# Patient Record
Sex: Male | Born: 1980 | Race: Black or African American | Hispanic: No | Marital: Single | Smoking: Never smoker
Health system: Southern US, Community
[De-identification: ages and names within clinical notes are randomized; demographics above are authoritative.]

---

## 2009-12-23 ENCOUNTER — Emergency Department: Payer: Self-pay | Admitting: Emergency Medicine

## 2018-12-03 ENCOUNTER — Emergency Department: Payer: Self-pay

## 2018-12-03 ENCOUNTER — Emergency Department
Admission: EM | Admit: 2018-12-03 | Discharge: 2018-12-03 | Disposition: A | Discharge status: Home / Self Care | Payer: Self-pay | Attending: Emergency Medicine | Admitting: Emergency Medicine

## 2018-12-03 ENCOUNTER — Other Ambulatory Visit: Payer: Self-pay

## 2018-12-03 ENCOUNTER — Encounter: Payer: Self-pay | Admitting: Emergency Medicine

## 2018-12-03 DIAGNOSIS — K8020 Calculus of gallbladder without cholecystitis without obstruction: Secondary | ICD-10-CM | POA: Insufficient documentation

## 2018-12-03 DIAGNOSIS — R1013 Epigastric pain: Secondary | ICD-10-CM | POA: Insufficient documentation

## 2018-12-03 LAB — COMPREHENSIVE METABOLIC PANEL
ALT: 17 U/L (ref 0–44)
AST: 21 U/L (ref 15–41)
Albumin: 4.3 g/dL (ref 3.5–5.0)
Alkaline Phosphatase: 74 U/L (ref 38–126)
Anion gap: 9 (ref 5–15)
BUN: 21 mg/dL — ABNORMAL HIGH (ref 6–20)
CO2: 23 mmol/L (ref 22–32)
Calcium: 9.1 mg/dL (ref 8.9–10.3)
Chloride: 105 mmol/L (ref 98–111)
Creatinine, Ser: 0.99 mg/dL (ref 0.61–1.24)
GFR calc Af Amer: >60 mL/min (ref >60)
GFR calc non Af Amer: >60 mL/min (ref >60)
Glucose, Bld: 147 mg/dL — ABNORMAL HIGH (ref 70–99)
Potassium: 3.5 mmol/L (ref 3.5–5.1)
Sodium: 137 mmol/L (ref 135–145)
Total Bilirubin: 0.5 mg/dL (ref 0.3–1.2)
Total Protein: 7.8 g/dL (ref 6.5–8.1)

## 2018-12-03 LAB — CBC
HCT: 40.1 % (ref 39.0–52.0)
Hemoglobin: 13.3 g/dL (ref 13.0–17.0)
MCH: 27.1 pg (ref 26.0–34.0)
MCHC: 33.2 g/dL (ref 30.0–36.0)
MCV: 81.8 fL (ref 80.0–100.0)
Platelets: 251 10*3/uL (ref 150–400)
RBC: 4.9 MIL/uL (ref 4.22–5.81)
RDW: 13.2 % (ref 11.5–15.5)
WBC: 7.5 10*3/uL (ref 4.0–10.5)
nRBC: 0 % (ref 0.0–0.2)

## 2018-12-03 LAB — LIPASE, BLOOD: Lipase: 27 U/L (ref 11–51)

## 2018-12-03 MED ORDER — ONDANSETRON 4 MG PO TBDP: 4.0000 mg | ORAL_TABLET | Freq: Three times a day (TID) | ORAL | 0 refills | Status: DC | PRN

## 2018-12-03 MED ORDER — HYDROCODONE-ACETAMINOPHEN 5-325 MG PO TABS: 1.0000 | ORAL_TABLET | Freq: Four times a day (QID) | ORAL | 0 refills | Status: DC | PRN

## 2018-12-03 MED ORDER — OXYCODONE-ACETAMINOPHEN 5-325 MG PO TABS
1.0000 | ORAL_TABLET | Freq: Once | ORAL | Status: AC
  Administered 2018-12-03: 1 via ORAL
  Filled 2018-12-03: qty 1

## 2018-12-03 MED ORDER — OMEPRAZOLE 20 MG PO CPDR: 20.0000 mg | DELAYED_RELEASE_CAPSULE | Freq: Two times a day (BID) | ORAL | 0 refills | Status: DC

## 2018-12-03 MED ORDER — SODIUM CHLORIDE 0.9 % IV BOLUS
1000.0000 mL | Freq: Once | INTRAVENOUS | Status: AC
  Administered 2018-12-03: 09:00:00 1000 mL via INTRAVENOUS

## 2018-12-03 MED ORDER — IOHEXOL 300 MG/ML  SOLN
100.0000 mL | Freq: Once | INTRAMUSCULAR | Status: AC | PRN
  Administered 2018-12-03: 100 mL via INTRAVENOUS

## 2018-12-03 MED ORDER — ONDANSETRON HCL 4 MG/2ML IJ SOLN
4.0000 mg | Freq: Once | INTRAMUSCULAR | Status: AC
  Administered 2018-12-03: 4 mg via INTRAVENOUS
  Filled 2018-12-03: qty 2

## 2018-12-03 MED ORDER — HYDROMORPHONE HCL 1 MG/ML IJ SOLN
1.0000 mg | Freq: Once | INTRAMUSCULAR | Status: AC
  Administered 2018-12-03: 1 mg via INTRAVENOUS
  Filled 2018-12-03: qty 1

## 2018-12-03 NOTE — ED Provider Notes (Signed)
[facility] Emergency Department Provider Note [facility] ____________________________________________   First MD Initiated Contact with Patient 12/03/18 0809     (approximate)  I have reviewed the triage vital signs and the nursing notes.   HISTORY  Chief Complaint Abdominal Pain    HPI Matthew Church is a 38 y.o. male  Here with abdominal pain, nausea, vomiting.  Patient states that his symptoms started late last night, around 10:11 PM.  He states that he was in his usual state of health until try to go to bed.  He then developed gradual onset of progressively worsening aching, throbbing, epigastric pain with associated nausea.  He has had significant, severe, primarily epigastric pain since then.  The pain does not radiate to his back or sides.  He has some associated right upper quadrant tenderness with it.  Denies any history of similar symptoms in the past.  Denies any alleviating factors.  Of note, his pain began after eating pizza and chicken wings.  No known history of gallstones.  No fevers.  No sick contacts.        History reviewed. No pertinent past medical history.  There are no active problems to display for this patient.   History reviewed. No pertinent surgical history.  Prior to Admission medications   Medication Sig Start Date End Date Taking? Authorizing Provider  HYDROcodone-acetaminophen (NORCO/VICODIN) 5-325 MG tablet Take 1-2 tablets by mouth every 6 (six) hours as needed for moderate pain or severe pain. 12/03/18 12/03/19  Shaune PollackIsaacs, Sakoya Win, MD  omeprazole (PRILOSEC) 20 MG capsule Take 1 capsule (20 mg total) by mouth 2 (two) times daily before a meal for 10 days. 12/03/18 12/13/18  Shaune PollackIsaacs, Jaquell Seddon, MD  ondansetron (ZOFRAN ODT) 4 MG disintegrating tablet Take 1 tablet (4 mg total) by mouth every 8 (eight) hours as needed for nausea or vomiting. 12/03/18   Shaune PollackIsaacs, Quenna Doepke, MD    Allergies Patient has no allergy information on record.  No family  history on file.  Social History Social History   Tobacco Use  . Smoking status: Not on file  Substance Use Topics  . Alcohol use: Not on file  . Drug use: Not on file    Review of Systems  Review of Systems  Constitutional: Positive for fatigue. Negative for chills and fever.  HENT: Negative for sore throat.   Respiratory: Negative for shortness of breath.   Cardiovascular: Negative for chest pain.  Gastrointestinal: Positive for abdominal pain and nausea.  Genitourinary: Negative for flank pain.  Musculoskeletal: Negative for neck pain.  Skin: Negative for rash and wound.  Allergic/Immunologic: Negative for immunocompromised state.  Neurological: Negative for weakness and numbness.  Hematological: Does not bruise/bleed easily.  All other systems reviewed and are negative.    ____________________________________________  PHYSICAL EXAM:      VITAL SIGNS: ED Triage Vitals [12/03/18 0816]  Enc Vitals Group     BP 121/62     Pulse Rate 78     Resp 18     Temp 98.2 F (36.8 C)     Temp Source Oral     SpO2 100 %     Weight 270 lb (122.5 kg)     Height 5\' 11"  (1.803 m)     Head Circumference      Peak Flow      Pain Score 10     Pain Loc      Pain Edu?      Excl. in GC?  Physical Exam Vitals signs and nursing note reviewed.  Constitutional:      General: He is not in acute distress.    Appearance: He is well-developed.     Comments: Appears uncomfortable  HENT:     Head: Normocephalic and atraumatic.  Eyes:     Conjunctiva/sclera: Conjunctivae normal.  Neck:     Musculoskeletal: Neck supple.  Cardiovascular:     Rate and Rhythm: Normal rate and regular rhythm.     Heart sounds: Normal heart sounds. No murmur. No friction rub.  Pulmonary:     Effort: Pulmonary effort is normal. No respiratory distress.     Breath sounds: Normal breath sounds. No wheezing or rales.  Abdominal:     General: There is no distension.     Palpations: Abdomen is soft.      Tenderness: There is abdominal tenderness in the right upper quadrant and epigastric area. There is no right CVA tenderness, left CVA tenderness, guarding or rebound.  Skin:    General: Skin is warm.     Capillary Refill: Capillary refill takes less than 2 seconds.  Neurological:     Mental Status: He is alert and oriented to person, place, and time.     Motor: No abnormal muscle tone.       ____________________________________________   LABS (all labs ordered are listed, but only abnormal results are displayed)  Labs Reviewed  COMPREHENSIVE METABOLIC PANEL - Abnormal; Notable for the following components:      Result Value   Glucose, Bld 147 (*)    BUN 21 (*)    All other components within normal limits  LIPASE, BLOOD  CBC  URINALYSIS, COMPLETE (UACMP) WITH MICROSCOPIC    ____________________________________________  EKG: Normal sinus rhythm, ventricular rate 77.  PR 170, QRS 87, QTc 450.  RSR prime noted in V1 and 2, otherwise no evidence of bundle branch block.  Normal EKG with no signs of ischemia. ________________________________________  RADIOLOGY All imaging, including plain films, CT scans, and ultrasounds, independently reviewed by me, and interpretations confirmed via formal radiology reads.  ED MD interpretation:   CT: Cholelithiasis, no cholecystitis. Otherwise negative.  Official radiology report(s): Ct Abdomen Pelvis W Contrast  Result Date: 12/03/2018 CLINICAL DATA:  Abdominal distention, nausea, vomiting EXAM: CT ABDOMEN AND PELVIS WITH CONTRAST TECHNIQUE: Multidetector CT imaging of the abdomen and pelvis was performed using the standard protocol following bolus administration of intravenous contrast. CONTRAST:  100mL OMNIPAQUE IOHEXOL 300 MG/ML  SOLN COMPARISON:  None. FINDINGS: Lower chest: Lung bases are clear. No effusions. Heart is normal size. Hepatobiliary: Several layering gallstones within the gallbladder. No focal hepatic abnormality or biliary  ductal dilatation. Pancreas: No focal abnormality or ductal dilatation. Spleen: No focal abnormality.  Normal size. Adrenals/Urinary Tract: No adrenal abnormality. No focal renal abnormality. No stones or hydronephrosis. Urinary bladder is unremarkable. Stomach/Bowel: Normal appendix. Stomach, large and small bowel grossly unremarkable. Vascular/Lymphatic: No evidence of aneurysm or adenopathy. Reproductive: No visible focal abnormality. Other: No free fluid or free air. Musculoskeletal: No acute bony abnormality. IMPRESSION: Cholelithiasis.  No CT evidence for acute cholecystitis. Normal appendix. No acute findings in the abdomen or pelvis. Electronically Signed   By: Charlett NoseKevin  Dover M.D.   On: 12/03/2018 09:41    ____________________________________________  PROCEDURES   Procedure(s) performed (including Critical Care):  Procedures  ____________________________________________  INITIAL IMPRESSION / MDM / ASSESSMENT AND PLAN / ED COURSE  As part of my medical decision making, I reviewed the following data within the electronic  MEDICAL RECORD NUMBER Notes from prior ED visits and [place] Controlled Substance Database      *MICHOEL KUNIN was evaluated in Emergency Department on 12/03/2018 for the symptoms described in the history of present illness. He was evaluated in the context of the global COVID-19 pandemic, which necessitated consideration that the patient might be at risk for infection with the SARS-CoV-2 virus that causes COVID-19. Institutional protocols and algorithms that pertain to the evaluation of patients at risk for COVID-19 are in a state of rapid change based on information released by regulatory bodies including the CDC and federal and state organizations. These policies and algorithms were followed during the patient's care in the ED.  Some ED evaluations and interventions may be delayed as a result of limited staffing during the pandemic.*   Clinical Course as of Dec 03 1047  Wed Dec 02, 4909  4949 38 year old male with past medical history as above here with epigastric pain after eating pizza and chicken wings last night.  Clinically, I suspect likely gastritis, peptic ulcer disease, possibly cholecystitis though exam is more consistent with gastric etiology.  Pancreatitis also consideration.  Labs, CT pending.   [CI]  1017 Labs are very reassuring with normal white count, normal LFTs and bilirubin, and normal lipase.  No urinary symptoms so will hold on UA.  CT shows cholelithiasis but no evidence of cholecystitis, no biliary ductal dilation.  Low concern for cholecystitis or cholangitis or choledocholithiasis based on normal LFTs and lipase.  The patient's pain is also resolved.  Suspect he has acute gastritis, versus symptomatic cholelithiasis.  Will refer him to surgery, treat with analgesics and antacids, and gave strict return precautions with any recurrence or worsening pain.  Low-fat diet discussed.   [CI]    Clinical Course User Index [CI] Duffy Bruce, MD    Medical Decision Making: As above.  ____________________________________________  FINAL CLINICAL IMPRESSION(S) / ED DIAGNOSES  Final diagnoses:  Epigastric pain  Calculus of gallbladder without cholecystitis without obstruction     MEDICATIONS GIVEN DURING THIS VISIT:  Medications  HYDROmorphone (DILAUDID) injection 1 mg (1 mg Intravenous Given 12/03/18 0914)  ondansetron (ZOFRAN) injection 4 mg (4 mg Intravenous Given 12/03/18 0914)  sodium chloride 0.9 % bolus 1,000 mL (0 mLs Intravenous Stopped 12/03/18 1033)  iohexol (OMNIPAQUE) 300 MG/ML solution 100 mL (100 mLs Intravenous Contrast Given 12/03/18 0926)  oxyCODONE-acetaminophen (PERCOCET/ROXICET) 5-325 MG per tablet 1 tablet (1 tablet Oral Given 12/03/18 1014)     ED Discharge Orders         Ordered    HYDROcodone-acetaminophen (NORCO/VICODIN) 5-325 MG tablet  Every 6 hours PRN     12/03/18 1037    ondansetron (ZOFRAN ODT) 4 MG disintegrating  tablet  Every 8 hours PRN     12/03/18 1037    omeprazole (PRILOSEC) 20 MG capsule  2 times daily before meals     12/03/18 1038           Note:  This document was prepared using Dragon voice recognition software and may include unintentional dictation errors.   Duffy Bruce, MD 12/03/18 1049

## 2018-12-03 NOTE — ED Notes (Signed)
Pt tolerating water.  

## 2018-12-03 NOTE — ED Notes (Signed)
Topaz not working. Pt verbalized understanding of all instructions. All questions answered.

## 2018-12-03 NOTE — ED Triage Notes (Signed)
Pt arrives with complaints of upper abdominal pain that started last night. PT also reports nausea. Pt appears pale and weak in triage.

## 2018-12-03 NOTE — ED Notes (Signed)
Pt given water for PO challenge 

## 2018-12-25 ENCOUNTER — Encounter: Payer: Self-pay | Admitting: General Surgery

## 2018-12-25 ENCOUNTER — Other Ambulatory Visit: Payer: Self-pay

## 2018-12-25 ENCOUNTER — Ambulatory Visit (INDEPENDENT_AMBULATORY_CARE_PROVIDER_SITE_OTHER): Payer: Self-pay | Admitting: General Surgery

## 2018-12-25 VITALS — BP 156/87 | HR 83 | Temp 97.5°F | Ht 70.0 in | Wt 260.0 lb

## 2018-12-25 DIAGNOSIS — K8020 Calculus of gallbladder without cholecystitis without obstruction: Secondary | ICD-10-CM

## 2018-12-25 NOTE — Patient Instructions (Signed)
Patient to return as needed.The patient is aware to call back for any questions or concerns.  Cholelithiasis  Cholelithiasis is also called "gallstones." It is a kind of gallbladder disease. The gallbladder is an organ that stores a liquid (bile) that helps you digest fat. Gallstones may not cause symptoms (may be silent gallstones) until they cause a blockage, and then they can cause pain (gallbladder attack). Follow these instructions at home:  Take over-the-counter and prescription medicines only as told by your doctor.  Stay at a healthy weight.  Eat healthy foods. This includes: ? Eating fewer fatty foods, like fried foods. ? Eating fewer refined carbs (refined carbohydrates). Refined carbs are breads and grains that are highly processed, like white bread and white rice. Instead, choose whole grains like whole-wheat bread and brown rice. ? Eating more fiber. Almonds, fresh fruit, and beans are healthy sources of fiber.  Keep all follow-up visits as told by your doctor. This is important. Contact a doctor if:  You have sudden pain in the upper right side of your belly (abdomen). Pain might spread to your right shoulder or your chest. This may be a sign of a gallbladder attack.  You feel sick to your stomach (are nauseous).  You throw up (vomit).  You have been diagnosed with gallstones that have no symptoms and you get: ? Belly pain. ? Discomfort, burning, or fullness in the upper part of your belly (indigestion). Get help right away if:  You have sudden pain in the upper right side of your belly, and it lasts for more than 2 hours.  You have belly pain that lasts for more than 5 hours.  You have a fever or chills.  You keep feeling sick to your stomach or you keep throwing up.  Your skin or the whites of your eyes turn yellow (jaundice).  You have dark-colored pee (urine).  You have light-colored poop (stool). Summary  Cholelithiasis is also called "gallstones."   The gallbladder is an organ that stores a liquid (bile) that helps you digest fat.  Silent gallstones are gallstones that do not cause symptoms.  A gallbladder attack may cause sudden pain in the upper right side of your belly. Pain might spread to your right shoulder or your chest. If this happens, contact your doctor.  If you have sudden pain in the upper right side of your belly that lasts for more than 2 hours, get help right away. This information is not intended to replace advice given to you by your health care provider. Make sure you discuss any questions you have with your health care provider. Document Released: 10/17/2007 Document Revised: 04/12/2017 Document Reviewed: 01/15/2016 Elsevier Patient Education  2020 [employer].

## 2018-12-25 NOTE — Progress Notes (Signed)
Patient ID: Matthew BurnerMark S Baumgartner, male   DOB: 18-Sep-1980, 38 y.o.   MRN: [id number]  Chief Complaint  Patient presents with  . Other    HPI Matthew Church is a 38 y.o. male.   He presents as a referral from the emergency department, where he appeared on December 03, 2018.  He presented with abdominal pain and nausea.  He did not vomit, though he wanted to.  The pain was epigastric as well as in the right upper quadrant.  Labs and imaging performed in the emergency department did not suggest biliary obstruction or cholecystitis.  He did have cholelithiasis seen on the CT scan.  He was felt to most likely have gastritis, secondary to dietary indiscretion (he had eaten 5 slices of pizza and a box of wings that evening).  He was sent home with return precautions, low-fat diet recommendations, a proton pump inhibitor, and Zofran.  He is following up with us today regarding his gallstones.  The episode of the 22nd is the only time he is ever had similar symptoms.  He has not had any since.  He has never had pancreatitis or jaundice.   History reviewed. No pertinent past medical history.  History reviewed. No pertinent surgical history.  History reviewed. No pertinent family history.  Social History Social History   Tobacco Use  . Smoking status: Never Smoker  . Smokeless tobacco: Never Used  Substance Use Topics  . Alcohol use: Never    Frequency: Never  . Drug use: Never    No Known Allergies  No current outpatient medications on file.   No current facility-administered medications for this visit.     Review of Systems Review of Systems  All other systems reviewed and are negative.   Blood pressure (!) 156/87, pulse 83, temperature (!) 97.5 F (36.4 C), temperature source Skin, height 5\' 10"  (1.778 m), weight 260 lb (117.9 kg), SpO2 98 %.  Physical Exam Physical Exam Constitutional:      Appearance: Normal appearance. He is obese.  HENT:     Head: Normocephalic and atraumatic.     Nose:      Comments: Covered with a mask secondary to COVID-19 precautions    Mouth/Throat:     Comments: Covered with a mask secondary to COVID-19 precautions Eyes:     General: No scleral icterus.       Right eye: No discharge.        Left eye: No discharge.  Neck:     Musculoskeletal: Normal range of motion.  Cardiovascular:     Rate and Rhythm: Normal rate and regular rhythm.     Pulses: Normal pulses.     Heart sounds: No murmur.  Pulmonary:     Effort: Pulmonary effort is normal.     Breath sounds: Normal breath sounds.  Abdominal:     General: Bowel sounds are normal.     Palpations: Abdomen is soft.     Tenderness: There is no abdominal tenderness. There is no guarding.     Comments: Negative Murphy sign  Genitourinary:    Comments: Deferred Musculoskeletal:        General: No swelling or tenderness.     Right lower leg: No edema.     Left lower leg: No edema.  Lymphadenopathy:     Cervical: No cervical adenopathy.  Skin:    General: Skin is warm and dry.  Neurological:     General: No focal deficit present.     Mental  Status: He is alert.  Psychiatric:        Mood and Affect: Mood normal.        Behavior: Behavior normal.     Data Reviewed Results for Matthew BurnerROSS, Kaimani S (MRN [id number]) as of 12/25/2018 15:13  Ref. Range 12/03/2018 08:19  Sodium Latest Ref Range: 135 - 145 mmol/L 137  Potassium Latest Ref Range: 3.5 - 5.1 mmol/L 3.5  Chloride Latest Ref Range: 98 - 111 mmol/L 105  CO2 Latest Ref Range: 22 - 32 mmol/L 23  Glucose Latest Ref Range: 70 - 99 mg/dL 409147 (H)  BUN Latest Ref Range: 6 - 20 mg/dL 21 (H)  Creatinine Latest Ref Range: 0.61 - 1.24 mg/dL 8.110.99  Calcium Latest Ref Range: 8.9 - 10.3 mg/dL 9.1  Anion gap Latest Ref Range: 5 - 15  9  Alkaline Phosphatase Latest Ref Range: 38 - 126 U/L 74  Albumin Latest Ref Range: 3.5 - 5.0 g/dL 4.3  Lipase Latest Ref Range: 11 - 51 U/L 27  AST Latest Ref Range: 15 - 41 U/L 21  ALT Latest Ref Range: 0 - 44 U/L 17   Total Protein Latest Ref Range: 6.5 - 8.1 g/dL 7.8  Total Bilirubin Latest Ref Range: 0.3 - 1.2 mg/dL 0.5  GFR, Est Non African American Latest Ref Range: >60 mL/min >60  GFR, Est African American Latest Ref Range: >60 mL/min >60  WBC Latest Ref Range: 4.0 - 10.5 K/uL 7.5  RBC Latest Ref Range: 4.22 - 5.81 MIL/uL 4.90  Hemoglobin Latest Ref Range: 13.0 - 17.0 g/dL 91.413.3  HCT Latest Ref Range: 39.0 - 52.0 % 40.1  MCV Latest Ref Range: 80.0 - 100.0 fL 81.8  MCH Latest Ref Range: 26.0 - 34.0 pg 27.1  MCHC Latest Ref Range: 30.0 - 36.0 g/dL 78.233.2  RDW Latest Ref Range: 11.5 - 15.5 % 13.2  Platelets Latest Ref Range: 150 - 400 K/uL 251  nRBC Latest Ref Range: 0.0 - 0.2 % 0.0  The labs obtained in the emergency department on July 22 demonstrate no evidence of infection, no biliary obstruction.  I personally reviewed the CT scan performed on the same date.  I agree with the radiologist findings, as copied below: CLINICAL DATA:  Abdominal distention, nausea, vomiting  EXAM: CT ABDOMEN AND PELVIS WITH CONTRAST  TECHNIQUE: Multidetector CT imaging of the abdomen and pelvis was performed using the standard protocol following bolus administration of intravenous contrast.  CONTRAST:  100mL OMNIPAQUE IOHEXOL 300 MG/ML  SOLN  COMPARISON:  None.  FINDINGS: Lower chest: Lung bases are clear. No effusions. Heart is normal size.  Hepatobiliary: Several layering gallstones within the gallbladder. No focal hepatic abnormality or biliary ductal dilatation.  Pancreas: No focal abnormality or ductal dilatation.  Spleen: No focal abnormality.  Normal size.  Adrenals/Urinary Tract: No adrenal abnormality. No focal renal abnormality. No stones or hydronephrosis. Urinary bladder is unremarkable.  Stomach/Bowel: Normal appendix. Stomach, large and small bowel grossly unremarkable.  Vascular/Lymphatic: No evidence of aneurysm or adenopathy.  Reproductive: No visible focal  abnormality.  Other: No free fluid or free air.  Musculoskeletal: No acute bony abnormality.  IMPRESSION: Cholelithiasis.  No CT evidence for acute cholecystitis.  Normal appendix.  No acute findings in the abdomen or pelvis.   Electronically Signed   By: Charlett NoseKevin  Dover M.D.   On: 12/03/2018 09:41  Assessment This is a 38 year old man who presented to the emergency department with abdominal pain and nausea.  Differential diagnosis at that time included gastritis  as well as potential symptomatic cholelithiasis.  This was his first episode of any similar pain and he has not had any since.  We discussed that many people have gallstones and not everybody has symptoms.  I also diagrammed, using in office visual aids, what happens when biliary colic occurs.  At this time, Mr. Hashimi prefers to defer surgery.  He was given precautions for return, including repeat symptoms, jaundice or dark urine/acholic stools.  Plan We will see him on an as-needed basis.    Fredirick Maudlin 12/25/2018, 2:56 PM

## 2019-05-12 ENCOUNTER — Encounter: Payer: Self-pay | Admitting: Emergency Medicine

## 2019-05-12 ENCOUNTER — Emergency Department
Admission: EM | Admit: 2019-05-12 | Discharge: 2019-05-12 | Disposition: A | Discharge status: Home / Self Care | Payer: HRSA Program | Attending: Emergency Medicine | Admitting: Emergency Medicine

## 2019-05-12 ENCOUNTER — Emergency Department: Payer: HRSA Program

## 2019-05-12 ENCOUNTER — Other Ambulatory Visit: Payer: Self-pay

## 2019-05-12 DIAGNOSIS — Z79899 Other long term (current) drug therapy: Secondary | ICD-10-CM | POA: Diagnosis not present

## 2019-05-12 DIAGNOSIS — J1289 Other viral pneumonia: Secondary | ICD-10-CM | POA: Insufficient documentation

## 2019-05-12 DIAGNOSIS — B349 Viral infection, unspecified: Secondary | ICD-10-CM | POA: Diagnosis not present

## 2019-05-12 DIAGNOSIS — J189 Pneumonia, unspecified organism: Secondary | ICD-10-CM

## 2019-05-12 DIAGNOSIS — U071 COVID-19: Secondary | ICD-10-CM | POA: Diagnosis not present

## 2019-05-12 DIAGNOSIS — R109 Unspecified abdominal pain: Secondary | ICD-10-CM | POA: Diagnosis present

## 2019-05-12 LAB — URINALYSIS, COMPLETE (UACMP) WITH MICROSCOPIC
Bacteria, UA: NONE SEEN
Bilirubin Urine: NEGATIVE
Glucose, UA: NEGATIVE mg/dL
Hgb urine dipstick: NEGATIVE
Ketones, ur: 20 mg/dL — AB
Leukocytes,Ua: NEGATIVE
Nitrite: NEGATIVE
Protein, ur: 100 mg/dL — AB
Specific Gravity, Urine: 1.034 — ABNORMAL HIGH (ref 1.005–1.030)
pH: 6 (ref 5.0–8.0)

## 2019-05-12 LAB — BASIC METABOLIC PANEL
Anion gap: 10 (ref 5–15)
BUN: 18 mg/dL (ref 6–20)
CO2: 23 mmol/L (ref 22–32)
Calcium: 8.8 mg/dL — ABNORMAL LOW (ref 8.9–10.3)
Chloride: 100 mmol/L (ref 98–111)
Creatinine, Ser: 0.96 mg/dL (ref 0.61–1.24)
GFR calc Af Amer: >60 mL/min (ref >60)
GFR calc non Af Amer: >60 mL/min (ref >60)
Glucose, Bld: 120 mg/dL — ABNORMAL HIGH (ref 70–99)
Potassium: 3.6 mmol/L (ref 3.5–5.1)
Sodium: 133 mmol/L — ABNORMAL LOW (ref 135–145)

## 2019-05-12 LAB — CBC
HCT: 38.3 % — ABNORMAL LOW (ref 39.0–52.0)
Hemoglobin: 12.8 g/dL — ABNORMAL LOW (ref 13.0–17.0)
MCH: 26.4 pg (ref 26.0–34.0)
MCHC: 33.4 g/dL (ref 30.0–36.0)
MCV: 79 fL — ABNORMAL LOW (ref 80.0–100.0)
Platelets: 396 10*3/uL (ref 150–400)
RBC: 4.85 MIL/uL (ref 4.22–5.81)
RDW: 12.7 % (ref 11.5–15.5)
WBC: 9.2 10*3/uL (ref 4.0–10.5)
nRBC: 0 % (ref 0.0–0.2)

## 2019-05-12 MED ORDER — NAPROXEN 500 MG PO TABS: 500.0000 mg | ORAL_TABLET | Freq: Two times a day (BID) | ORAL | 0 refills | Status: AC

## 2019-05-12 MED ORDER — AZITHROMYCIN 250 MG PO TABS: ORAL_TABLET | ORAL | 0 refills | Status: AC

## 2019-05-12 MED ORDER — NAPROXEN 500 MG PO TABS
500.0000 mg | ORAL_TABLET | Freq: Once | ORAL | Status: AC
  Administered 2019-05-12: 500 mg via ORAL
  Filled 2019-05-12: qty 1

## 2019-05-12 MED ORDER — PREDNISONE 10 MG (21) PO TBPK: ORAL_TABLET | ORAL | 0 refills | Status: AC

## 2019-05-12 MED ORDER — ALBUTEROL SULFATE HFA 108 (90 BASE) MCG/ACT IN AERS: 2.0000 | INHALATION_SPRAY | Freq: Four times a day (QID) | RESPIRATORY_TRACT | 0 refills | Status: AC | PRN

## 2019-05-12 MED ORDER — PREDNISONE 20 MG PO TABS
60.0000 mg | ORAL_TABLET | Freq: Once | ORAL | Status: AC
  Administered 2019-05-12: 23:00:00 60 mg via ORAL
  Filled 2019-05-12: qty 3

## 2019-05-12 MED ORDER — AZITHROMYCIN 500 MG PO TABS
500.0000 mg | ORAL_TABLET | Freq: Once | ORAL | Status: AC
  Administered 2019-05-12: 23:00:00 500 mg via ORAL
  Filled 2019-05-12: qty 1

## 2019-05-12 NOTE — Discharge Instructions (Signed)
Quarantine until you are symptom free x 4 days. Return to the ER if your symptoms get worse.

## 2019-05-12 NOTE — ED Provider Notes (Signed)
[facility] Emergency Department Provider Note ____________________________________________  Time seen: Approximately 8:20 PM  I have reviewed the triage vital signs and the nursing notes.  HISTORY  Chief Complaint Back Pain   HPI Matthew Church is a 38 y.o. male presents to the emergency department for evaluation of right flank pain x 1 week. He is unable to reproduce the pain. He states that it "hits" intermittently. No history of kidney stone. No dysuria or hematuria.    History reviewed. No pertinent past medical history.  Patient Active Problem List   Diagnosis Date Noted  . 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 No pertinent surgical history.  Prior to Admission medications   Medication Sig Start Date End Date Taking? Authorizing Provider  albuterol (VENTOLIN HFA) 108 (90 Base) MCG/ACT inhaler Inhale 2 puffs into the lungs every 6 (six) hours as needed for wheezing or shortness of breath. 05/12/19   Naijah Lacek, Johnette Abraham B, FNP  azithromycin (ZITHROMAX) 250 MG tablet 2 tablets today, then 1 tablet for the next 4 days. 05/12/19   Carlos Quackenbush, Johnette Abraham B, FNP  naproxen (NAPROSYN) 500 MG tablet Take 1 tablet (500 mg total) by mouth 2 (two) times daily with a meal. 05/12/19   Ivery Michalski B, FNP  predniSONE (STERAPRED UNI-PAK 21 TAB) 10 MG (21) TBPK tablet Take 6 tablets on the first day and decrease by 1 tablet each day until finished. 05/12/19   Victorino Dike, FNP    Allergies Patient has no known allergies.  History reviewed. No pertinent family history.  Social History Social History   Tobacco Use  . Smoking status: Never Smoker  . Smokeless tobacco: Never Used  Substance Use Topics  . Alcohol use: Never  . Drug use: Never    Review of Systems Constitutional: Well appearing. Respiratory: Negative for dyspnea. Negative for shortness of breath. Cardiovascular: Negative for change in skin  temperature or color. Musculoskeletal:   Negative for chronic steroid use   Negative for trauma in the presence of osteoporosis  Negative for age over 84 and trauma.  Negative for constitutional symptoms, or history of cancer.  Negative for pain worse at night. Skin: Negative for rash, lesion, or wound.  Genitourinary: Negative for urinary retention. Rectal: Negative for fecal incontinence or new onset constipation/bowel habit changes. Hematological/Immunilogical: Negative for immunosuppression, IV drug use, or fever Neurological: Negative for burning, tingling, numb, electric, radiating pain in the lower extremities.                        Negative for saddle anesthesia.                        Negative for focal neurologic deficit, progressive or disabling symptoms             Negative for saddle anesthesia. ____________________________________________   PHYSICAL EXAM:  VITAL SIGNS: ED Triage Vitals  Enc Vitals Group     BP 05/12/19 1843 125/69     Pulse Rate 05/12/19 1843 100     Resp 05/12/19 1843 18     Temp 05/12/19 1843 100.1 F (37.8 C)     Temp Source 05/12/19 1843 Oral     SpO2 05/12/19 1843 95 %     Weight 05/12/19 1844 259 lb 14.8 oz (117.9 kg)     Height 05/12/19 1844 6' (1.829 m)     Head Circumference --  Peak Flow --      Pain Score 05/12/19 1844 0     Pain Loc --      Pain Edu? --      Excl. in GC? --     Constitutional: Alert and oriented. Well appearing and in no acute distress. Eyes: Conjunctivae are clear without discharge or drainage.  Head: Atraumatic. Neck: Full, active range of motion. Respiratory: Respirations even and unlabored. Musculoskeletal: Full ROM of the back and extremities, Strength 5/5 of the lower extremities as tested. Neurologic: Reflexes of the lower extremities are 2+. Negative for straight leg raise on the right or left side. Skin: Atraumatic.  Psychiatric: Behavior and affect are  flat.  ____________________________________________   LABS (all labs ordered are listed, but only abnormal results are displayed)  Labs Reviewed  URINALYSIS, COMPLETE (UACMP) WITH MICROSCOPIC - Abnormal; Notable for the following components:      Result Value   Color, Urine AMBER (*)    APPearance HAZY (*)    Specific Gravity, Urine 1.034 (*)    Ketones, ur 20 (*)    Protein, ur 100 (*)    All other components within normal limits  BASIC METABOLIC PANEL - Abnormal; Notable for the following components:   Sodium 133 (*)    Glucose, Bld 120 (*)    Calcium 8.8 (*)    All other components within normal limits  CBC - Abnormal; Notable for the following components:   Hemoglobin 12.8 (*)    HCT 38.3 (*)    MCV 79.0 (*)    All other components within normal limits  SARS CORONAVIRUS 2 (TAT 6-24 HRS)   ____________________________________________  RADIOLOGY  CT to rule out renal stone is negative for stone disease, however is concerning for right lower lobe pneumonia. ____________________________________________   PROCEDURES  Procedure(s) performed:  Procedures ____________________________________________   INITIAL IMPRESSION / ASSESSMENT AND PLAN / ED COURSE  Matthew Church is a 38 y.o. male presents to the emergency department for treatment and evaluation of right flank pain. Exam is reassuring as are labs and urinalysis. Plan will be to do CT to rule out stone disease. He does have a low grade temperature of 100.1 with a heart rate of 100. If CT is negative, will test for COVID-19 with plan for discharge. He states his mother is here as a patient as well but he doesn't remember what she came in for.  CT is negative for stone, but is concerning for pneumonia. Chest x-ray ordered. Patient updated.  Vital signs including oxygen saturation is reassuring. COVID-19 test is pending. He will be treated with azithromycin, albuterol inhaler, and prednisone. He is to quarantine until  symptom free x 4 days. He is to follow up with primary care or return to the ER for symptoms that change or worsen.   Matthew Church was evaluated in Emergency Department on 05/12/2019 for the symptoms described in the history of present illness. He was evaluated in the context of the global COVID-19 pandemic, which necessitated consideration that the patient might be at risk for infection with the SARS-CoV-2 virus that causes COVID-19. Institutional protocols and algorithms that pertain to the evaluation of patients at risk for COVID-19 are in a state of rapid change based on information released by regulatory bodies including the CDC and federal and state organizations. These policies and algorithms were followed during the patient's care in the ED.   Medications  naproxen (NAPROSYN) tablet 500 mg (500 mg Oral Given 05/12/19 2028)  azithromycin (ZITHROMAX) tablet 500 mg (500 mg Oral Given 05/12/19 2255)  predniSONE (DELTASONE) tablet 60 mg (60 mg Oral Given 05/12/19 2256)    ED Discharge Orders         Ordered    azithromycin (ZITHROMAX) 250 MG tablet     05/12/19 2248    predniSONE (STERAPRED UNI-PAK 21 TAB) 10 MG (21) TBPK tablet     05/12/19 2248    albuterol (VENTOLIN HFA) 108 (90 Base) MCG/ACT inhaler  Every 6 hours PRN     05/12/19 2248    naproxen (NAPROSYN) 500 MG tablet  2 times daily with meals     05/12/19 2248           Pertinent labs & imaging results that were available during my care of the patient were reviewed by me and considered in my medical decision making (see chart for details).  _________________________________________   FINAL CLINICAL IMPRESSION(S) / ED DIAGNOSES  Final diagnoses:  Community acquired pneumonia, bilateral  Acute viral syndrome     If controlled substance prescribed during this visit, 12 month history viewed on the [facility] prior to issuing an initial prescription for Schedule II or III opiod.   Chinita Pester, FNP 05/12/19 2321     Minna Antis, MD 05/12/19 [phone]

## 2019-05-12 NOTE — ED Triage Notes (Signed)
Pt presents to ED via POV with c/o back pain "around [his] kidney". Pt states pain is intermittent but when pain flairs up he "is unable to breathe due to pain". Pt denies urinary symptoms at this time. Pt is here with his mother who is also a patient at this time.

## 2019-05-13 LAB — SARS CORONAVIRUS 2 (TAT 6-24 HRS): SARS Coronavirus 2: POSITIVE — AB

## 2019-05-14 ENCOUNTER — Telehealth: Payer: Self-pay | Admitting: Emergency Medicine

## 2019-05-14 NOTE — Telephone Encounter (Signed)
Called to assure he is aware of covid result.  Sister/caregiver says he is sleeping, but they are aware of covid.

## 2020-06-12 IMAGING — CT CT RENAL STONE PROTOCOL
2 of 4 series · 17 of 46 positions shown, 19 images · non-contrast
Comparison: 12/03/2018

CLINICAL DATA: Severe flank pain.  Kidney stones suspected.

EXAM:
CT ABDOMEN AND PELVIS WITHOUT CONTRAST
TECHNIQUE: Multidetector CT imaging of the abdomen and pelvis was performed
following the standard protocol without IV contrast.

[Series 2: stone full standard · axial · 0.84mm/px · z∈[-362,+133]mm · 14 of 109 slices shown, 16 images]
[im 5/109  soft-tissue]
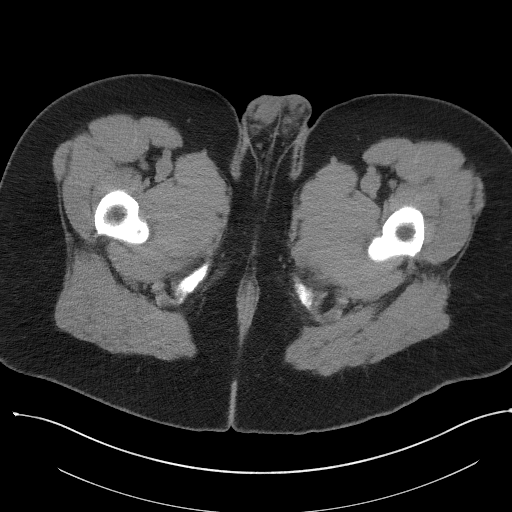
[im 5/109  bone]
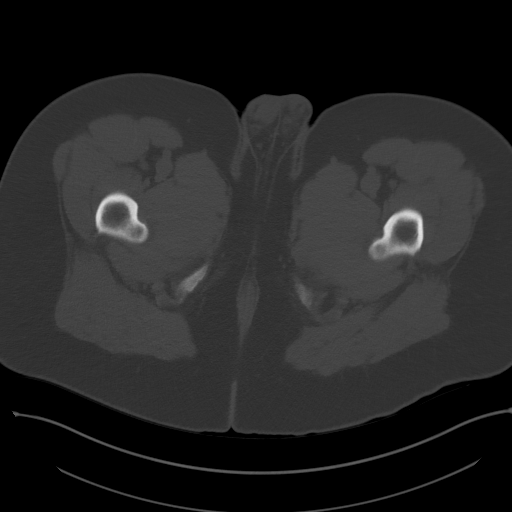
[im 13/109  soft-tissue]
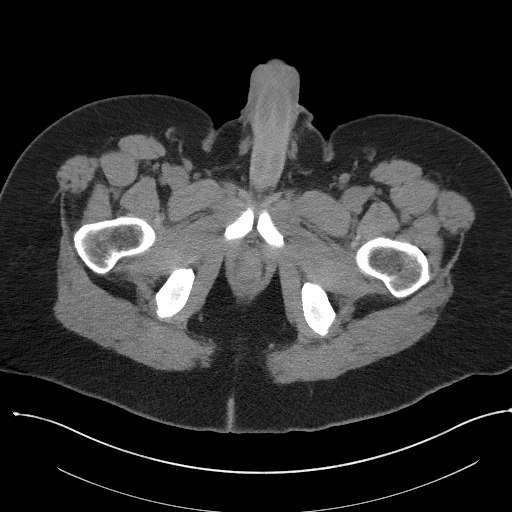
[im 21/109  soft-tissue]
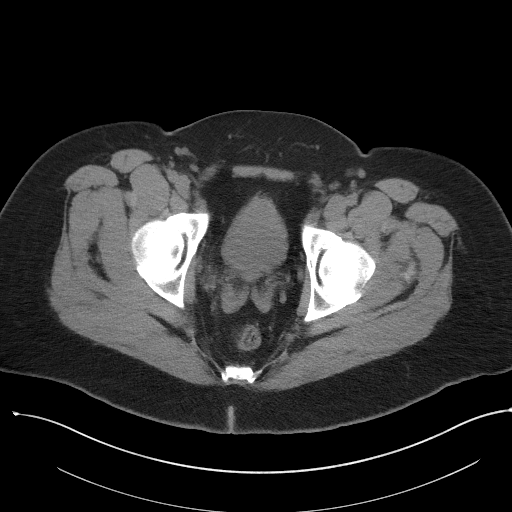
[im 30/109  soft-tissue]
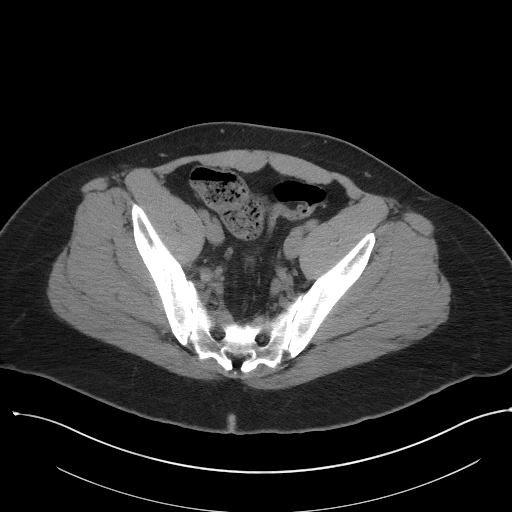
[im 38/109  soft-tissue]
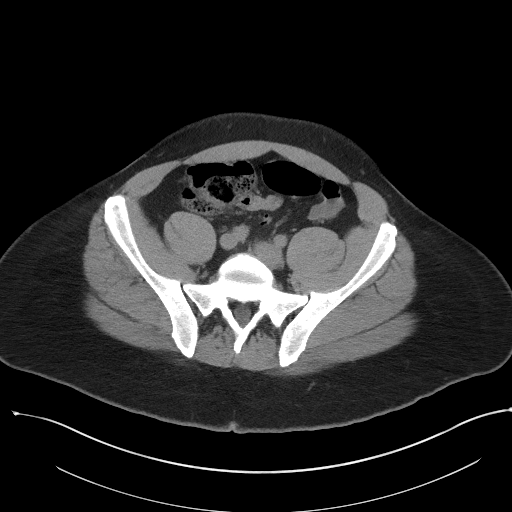
[im 42/109  soft-tissue]
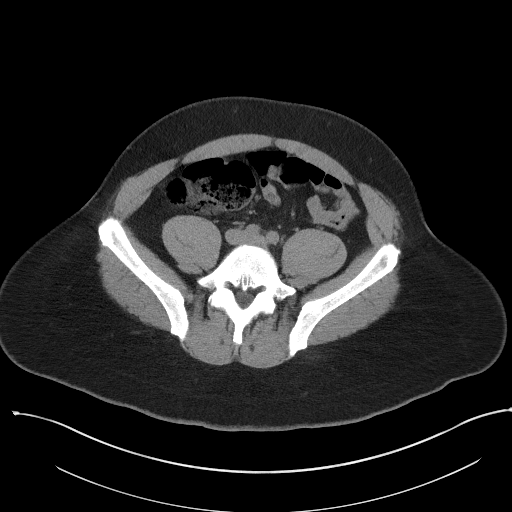
[im 50/109  soft-tissue]
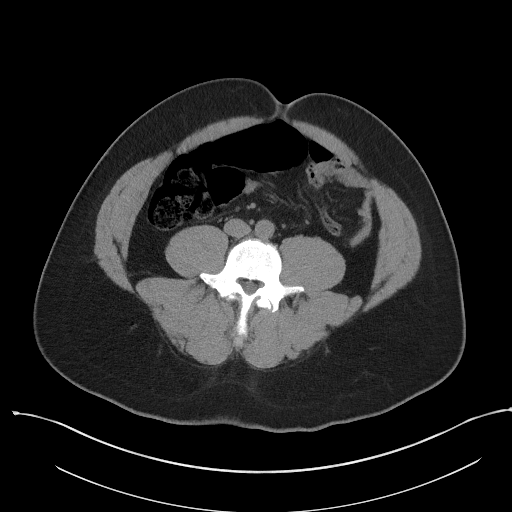
[im 59/109  soft-tissue]
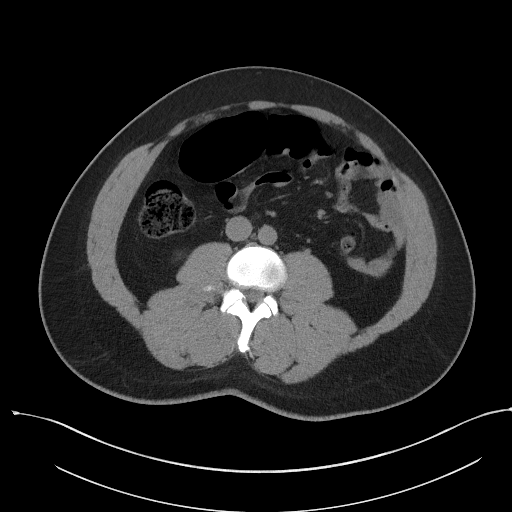
[im 67/109  soft-tissue]
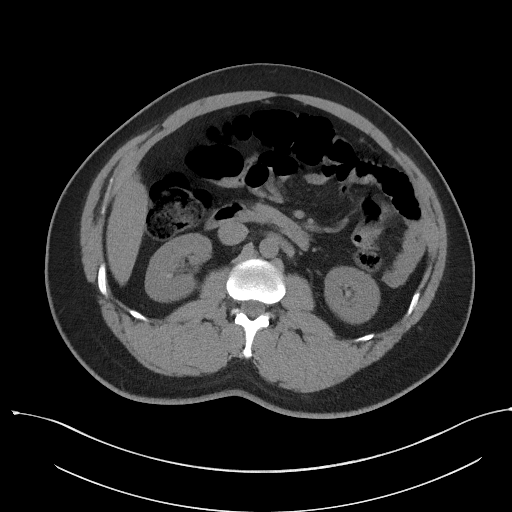
[im 67/109  bone]
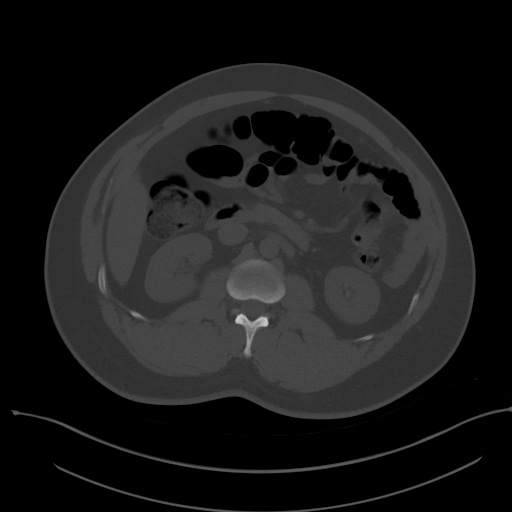
[im 71/109  soft-tissue]
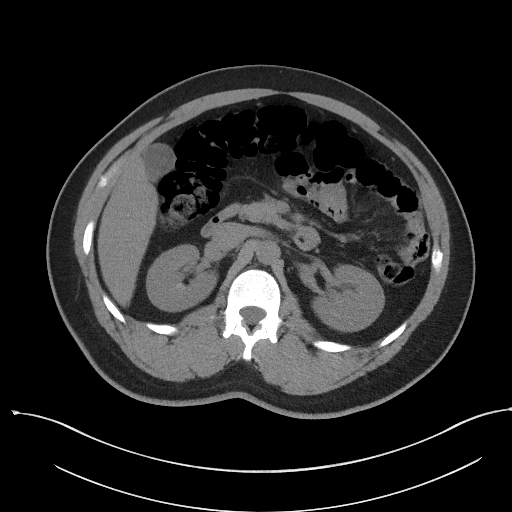
[im 79/109  soft-tissue]
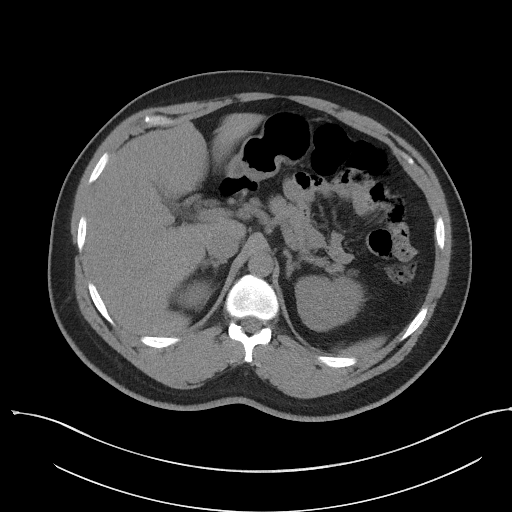
[im 88/109  soft-tissue]
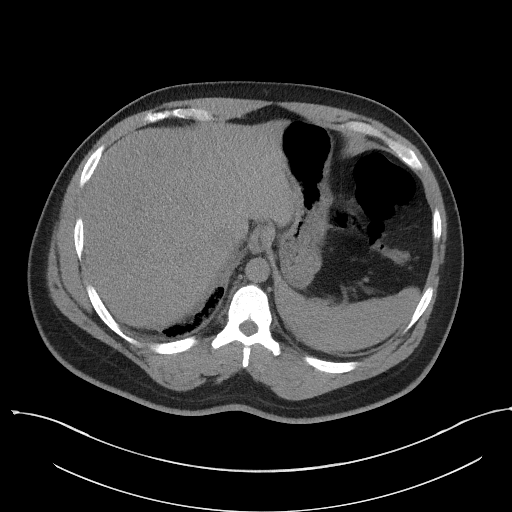
[im 96/109  soft-tissue]
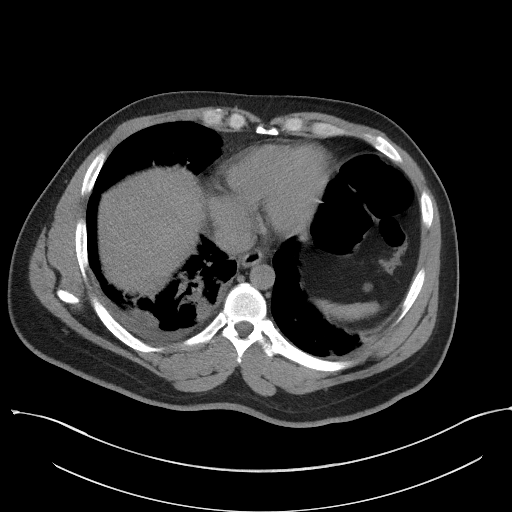
[im 104/109  soft-tissue]
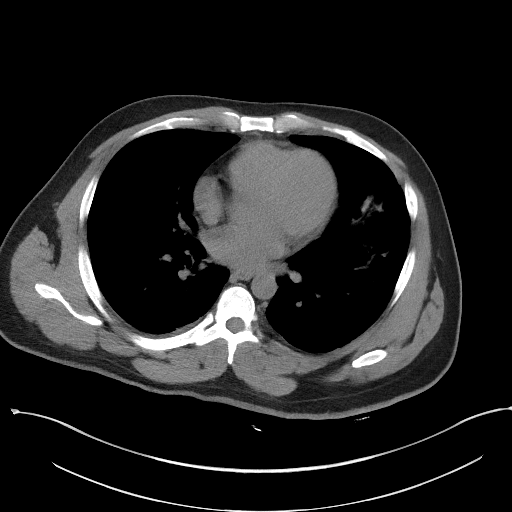

[Series 5: coronal · coronal · 0.95mm/px · 3 of 147 slices shown]
[im 49/147  soft-tissue]
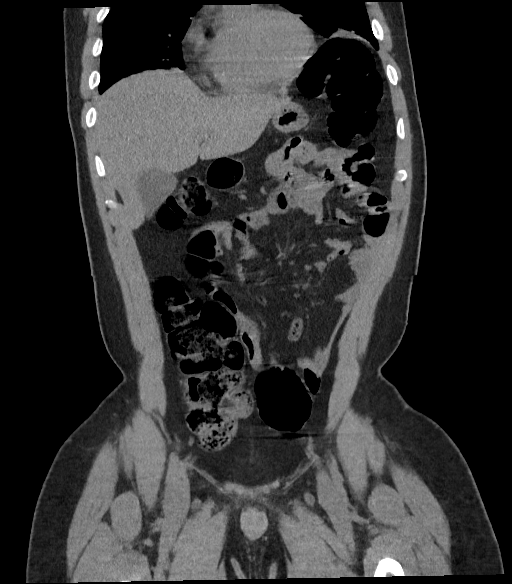
[im 65/147  soft-tissue]
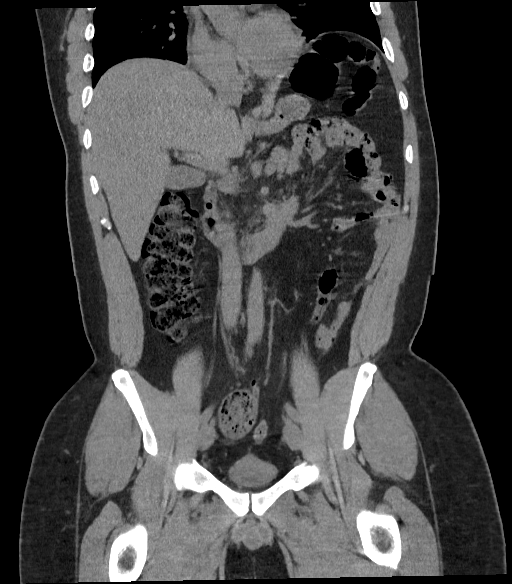
[im 82/147  soft-tissue]
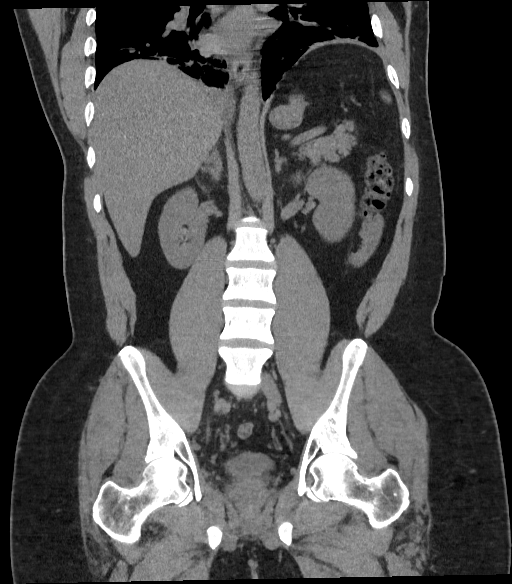

[17 of 46 positions shown; findings below may reference images not displayed]

FINDINGS: Lower Chest: Patchy airspace disease in posterior right lower lobe
and tiny right pleural effusion.

Hepatobiliary: No hepatic masses identified. Gallbladder is
unremarkable. No evidence of biliary ductal dilatation.

Pancreas:  No mass or inflammatory changes.

Spleen: Within normal limits in size and appearance.

Adrenals/Urinary Tract: No masses identified. No evidence of
hydronephrosis.

Stomach/Bowel: No evidence of obstruction, inflammatory process or
abnormal fluid collections. Normal appendix visualized.

Vascular/Lymphatic: No pathologically enlarged lymph nodes. No
abdominal aortic aneurysm.

Reproductive:  No mass or other significant abnormality.

Other:  None.

Musculoskeletal:  No suspicious bone lesions identified.
IMPRESSION: No acute findings within the abdomen or pelvis. No evidence of
urolithiasis or hydronephrosis.

Patchy posterior right lower lobe airspace disease and tiny right
pleural effusion, suspicious for pneumonia. Consider chest
radiograph for further evaluation.

## 2021-02-07 ENCOUNTER — Encounter: Payer: Self-pay | Admitting: General Surgery
